# Patient Record
Sex: Male | Born: 1980 | Race: White | Hispanic: No | Marital: Married | Smoking: Current every day smoker
Health system: Southern US, Community
[De-identification: ages and names within clinical notes are randomized; demographics above are authoritative.]

## PROBLEM LIST (undated history)

## (undated) DIAGNOSIS — M1990 Unspecified osteoarthritis, unspecified site: Secondary | ICD-10-CM

## (undated) DIAGNOSIS — I10 Essential (primary) hypertension: Secondary | ICD-10-CM

## (undated) HISTORY — PX: TESTICLE SURGERY: SHX794

---

## 2005-12-03 ENCOUNTER — Emergency Department (HOSPITAL_COMMUNITY): Admission: EM | Admit: 2005-12-03 | Discharge: 2005-12-03 | Payer: Self-pay | Admitting: Emergency Medicine

## 2014-07-25 ENCOUNTER — Emergency Department (HOSPITAL_COMMUNITY)
Admission: EM | Admit: 2014-07-25 | Discharge: 2014-07-25 | Disposition: A | Discharge status: Home / Self Care | Payer: Medicaid Other | Attending: Emergency Medicine | Admitting: Emergency Medicine

## 2014-07-25 ENCOUNTER — Encounter (HOSPITAL_COMMUNITY): Payer: Self-pay | Admitting: Emergency Medicine

## 2014-07-25 ENCOUNTER — Emergency Department (HOSPITAL_COMMUNITY): Payer: Medicaid Other

## 2014-07-25 DIAGNOSIS — S6991XA Unspecified injury of right wrist, hand and finger(s), initial encounter: Secondary | ICD-10-CM

## 2014-07-25 DIAGNOSIS — Y929 Unspecified place or not applicable: Secondary | ICD-10-CM | POA: Diagnosis not present

## 2014-07-25 DIAGNOSIS — S6990XA Unspecified injury of unspecified wrist, hand and finger(s), initial encounter: Secondary | ICD-10-CM | POA: Insufficient documentation

## 2014-07-25 DIAGNOSIS — I10 Essential (primary) hypertension: Secondary | ICD-10-CM | POA: Insufficient documentation

## 2014-07-25 DIAGNOSIS — Y939 Activity, unspecified: Secondary | ICD-10-CM | POA: Insufficient documentation

## 2014-07-25 DIAGNOSIS — F17200 Nicotine dependence, unspecified, uncomplicated: Secondary | ICD-10-CM | POA: Diagnosis not present

## 2014-07-25 DIAGNOSIS — W1809XA Striking against other object with subsequent fall, initial encounter: Secondary | ICD-10-CM | POA: Diagnosis not present

## 2014-07-25 HISTORY — DX: Essential (primary) hypertension: I10

## 2014-07-25 MED ORDER — TRAMADOL HCL 50 MG PO TABS: 50.0000 mg | ORAL_TABLET | Freq: Four times a day (QID) | ORAL | Status: AC | PRN

## 2014-07-25 MED ORDER — OXYCODONE-ACETAMINOPHEN 5-325 MG PO TABS
1.0000 | ORAL_TABLET | Freq: Once | ORAL | Status: AC
  Administered 2014-07-25: 1 via ORAL
  Filled 2014-07-25: qty 1
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
## 2014-07-25 NOTE — Discharge Instructions (Signed)
°Emergency Department Resource Guide °1) Find a Doctor and Pay Out of Pocket °Although you won't have to find out who is covered by your insurance plan, it is a good idea to ask around and get recommendations. You will then need to call the office and see if the doctor you have chosen will accept you as a new patient and what types of options they offer for patients who are self-pay. Some doctors offer discounts or will set up payment plans for their patients who do not have insurance, but you will need to ask so you aren't surprised when you get to your appointment. ° °2) Contact Your Local Health Department °Not all health departments have doctors that can see patients for sick visits, but many do, so it is worth a call to see if yours does. If you don't know where your local health department is, you can check in your phone book. The CDC also has a tool to help you locate your state's health department, and many state websites also have listings of all of their local health departments. ° °3) Find a Walk-in Clinic °If your illness is not likely to be very severe or complicated, you may want to try a walk in clinic. These are popping up all over the country in pharmacies, drugstores, and shopping centers. They're usually staffed by nurse practitioners or physician assistants that have been trained to treat common illnesses and complaints. They're usually fairly quick and inexpensive. However, if you have serious medical issues or chronic medical problems, these are probably not your best option. ° °No Primary Care Doctor: °- Call [facility] at  832-8000 - they can help you locate a primary care doctor that  accepts your insurance, provides certain services, etc. °- Physician Referral Service- 1-800-533-3463 ° °Chronic Pain Problems: °Organization         Address  Phone   Notes  °[facility]  (336) 297-2271 Patients need to be referred by their primary care doctor.  ° °Medication  Assistance: °Organization         Address  Phone   Notes  °[place] [facility] [address] °[place], [place] [address] (336) 641-8030 --Must be a resident of [place] °-- Must have NO insurance coverage whatsoever (no Medicaid/ Medicare, etc.) °-- The pt. MUST have a primary care doctor that directs their care regularly and follows them in the community °  °[facility]  (866) 331-1348   °[employer]  (888) 892-1162   ° °Agencies that provide inexpensive medical care: °Organization         Address  Phone   Notes  °[facility]  (336) 832-8035   °[facility]    (336) 832-7272   °[facility] [address] °[place], [place] [address] (336) 832-4777   °[facility] [address], °[place] (336) 271-4999   °[facility]    (336) 373-0678   °[facility]    (336) 272-1050   °[facility] ° [address], [place] Phone:  (336) 832-4444, Fax:  (336) 832-4440 Hours of Operation:  9 am - 6 pm, M-F.  Also accepts Medicaid/Medicare and self-pay.  °[facility] ° [address],  Phone: (336) 832-3150, Fax: (336) 832-3151. Hours of Operation:  8:30 am - 5:30 pm, M-F.  Also accepts Medicaid and self-pay.  °[facility] [place] [address], [place] Phone: (336) 878-6027   °[facility] [address], [place], [place] (336)723-1848, Ext. 123 Mondays & Thursdays: 7-9 AM.  First 15 patients are seen on a first come, first serve basis. °  ° °Medicaid-accepting [place] Providers: ° °Organization         Address  Phone   Notes  °[facility] [address], [place] (336) 641-2100 Also accepts self-pay patients.  °[facility] [address], [place] ° (336) 856-9996   °[facility] [address], [place]  (336) 288-8857   °[facility] [address] [place] [address], [place] (336) 299-7000   °Veita Bland [address], [place]  ° (336) 373-1557 Only accepts [place] Access Medicaid patients after they have their name applied to their card.  ° °Self-Pay (no insurance) in [place]: ° °Organization         Address  Phone   Notes  °Sickle Cell Patients, [facility] [address], [place] (336) 832-1970   °[facility] [address], [place] (336) 832-4400   °[facility] ° [address], [place] (336) 992-4800   °[facility]/Dr. Osei-Bonsu ° [address] [place] [address], [place] or [address], [place] (336) 841-8500 Phone number for both [place] and [place] locations is the same.  °[facility] [address], [place] (336) 299-0000   °[facility] [address] [place] [address], [place] or [address] (336) 852-7530 °(336) 878-2260   °[facility] [address] [place] [address], [place] (336) 350-1642, phone; (336) 294-5005, fax Sees patients 1st and 3rd Saturday of every month.  Must not qualify for public or private insurance (i.e. Medicaid, Medicare, [place] Health Choice, Veterans' Benefits) • Household income should be no more than 200% of the poverty level •The clinic cannot treat you if you are pregnant or think you are pregnant • Sexually transmitted diseases are not treated at the clinic.  ° ° °Dental Care: °Organization         Address  Phone  Notes  °[place] [facility] [address], [place] (336) 641-6152 Accepts children up to age 21 who are enrolled in Medicaid or [place] Health Choice; pregnant women with a Medicaid card; and children who have applied for Medicaid or [place] Health Choice, but were declined, whose parents can pay a reduced fee at time of service.  °[place]  [facility] [place]  [address], [place] (336) 641-7733 Accepts children up to age 21 who are enrolled in Medicaid or [place] Health Choice; pregnant women with a Medicaid card; and children who have applied for Medicaid or [place] Health Choice, but were declined, whose parents can pay a reduced fee at time of service.  °[facility] ° [address],  (336) 641-4533 Patients are seen by appointment only. Walk-ins are not accepted. [facility] will see patients 18 years of age and older. °Monday - Tuesday (8am-5pm) °Most Wednesdays (8:30-5pm) °$30 per visit, cash only  °[facility] ° [address], [place] (336) 641-4533 Patients are seen by appointment only. Walk-ins are not accepted. [facility] will see patients 18 years of age and older. °One   Wednesday Evening (Monthly: Volunteer Based).  $30 per visit, cash only  °[facility]  (919) 537-3737 for adults; Children under age 4, call Graduate Pediatric Dentistry at (919) [phone]. Children aged 4-14, please call (919) [phone] to request a pediatric application. ° Dental services are provided in all areas of dental care including fillings, crowns and bridges, complete and partial dentures, implants, gum treatment, root canals, and extractions. Preventive care is also provided. Treatment is provided to both adults and children. °Patients are selected via a lottery and there is often a waiting list. °  °[facility] [address], °[place] ° (336) 763-8833 [email or web address] °  °[facility] [address], [place], [place] (336)723-1848, Ext. 123 Second and Fourth Thursday of each month, opens at 6:30 AM; Clinic ends at 9 AM.  Patients are seen on a first-come first-served basis, and a limited number are seen during each clinic.  ° °[facility] ° [address], [place], [place] (336) 723-7904    Eligibility Requirements °You must have lived in [place], [place], or [place] counties for at least the last three months. °  You cannot be eligible for state or federal sponsored healthcare insurance, including Veterans Administration, Medicaid, or Medicare. °  You generally cannot be eligible for healthcare insurance through your employer.  °  How to apply: °Eligibility screenings are held every Tuesday and Wednesday afternoon from 1:00 pm until 4:00 pm. You do not need an appointment for the interview!  °[facility] [address], [place], [place] 336-631-2330   °[facility]  336-342-8273   °[facility]  336-703-3100   °[facility]  336-570-6415   ° °Behavioral Health Resources in the Community: °Intensive Outpatient Programs °Organization         Address  Phone  Notes  °[facility] [address], [place], [place] 336-878-6098   °[facility] [address], [place], [place] 336-832-9800   °[facility] [address], [place], [place] ° 336-882-2125   °[facility] [address],  °[place], [place] 1-800-853-5163 or 336-641-4981   °Substance Abuse Resources °Organization         Address  Phone  Notes  °[facility]  336-882-2125   °[facility]  336-784-9470   °[facility]  336-285-9073   °[facility]  336-845-3988   °Residential & Outpatient Substance Abuse Program  1-800-659-3381   °Psychological Services °Organization         Address  Phone  Notes  °[facility]  336- 832-9600   °[facility]  336- 378-7881   °[facility] [address], [place] 1-800-853-5163 or 336-641-4981   ° °Mobile Crisis Teams °Organization         Address  Phone  Notes  °[facility], Mobile Crisis Care Unit  1-877-626-1772   °Assertive °[facility] ° [address]  [place], [place] 336-834-9664   °Sharon DeEsch [address] °[place] 336-554-5454   ° °Self-Help/Support Groups °Organization         Address  Phone             Notes  °[facility] - variety of support groups  336- 373-1402 Call for more information  °Narcotics Anonymous (NA), [facility] [address], °[place]  2 meetings at this location  ° °  Residential Treatment Programs °Organization         Address  Phone  Notes  °[facility] [address],    °[place]  1-866-801-8205   °[facility] ° [address], [place], [place] 704-293-8524   °[facility] [address], [place] 336-845-3988 Admissions: 8am-3pm M-F  °[facility] [address]    °[place], [place] 336-841-1104   °[facility] [address], [place], [place] 336-379-7146   °[facility] [address]  °[place], [place] 336-285-9073   °[facility] - Intensive Outpatient [address], [place], [place] 336-852-3033   °[facility] [address]  °[place], [place] 1-877-615-2722 or 336-784-9470   °[facility] [address] [place], [place] 336-227-7417 Accepts Medicaid  °[facility] [address]  ° [place] 1-800-659-3381 Substance Abuse/Addiction Treatment  ° °[place] Behavioral Health Resources °Organization         Address  Phone  Notes  °[facility]  (888) 581-9988   °Julie Brannon, PhD [address] [place], [place]   (336) 349-5553 or (336) 951-0000   °[facility]   [address] °[place], [place] (336) 349-4454   °[facility] [address], [place], [place] (336) 342-8316 Insurance/Medicaid/sponsorship through [facility]  °[facility] [address]                                    [place], [place] (336) 342-8316 Therapy/tele-psych/case    °[facility] [address]  ° [place], [place] (336) 349-2233    °Dr. Arfeen  (336) 349-4544   °[facility] [place]  [employer] [place] [facility] 1) [address], [place] °2) [address], [place] °3)  [address], [place] (336) 349-3220 °(336) 342-7768 ° °(336) 342-8140   °[place] [facility] (336) 342-1394 or (336) 342-3537 (After Hours)    ° ° °
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
## 2014-07-28 NOTE — ED Provider Notes (Signed)
Medical screening examination/treatment/procedure(s) were performed by non-physician practitioner and as supervising physician I was immediately available for consultation/collaboration.   EKG Interpretation None        Dawnmarie Breon, MD 07/28/14 1034 

## 2014-10-19 ENCOUNTER — Emergency Department (HOSPITAL_BASED_OUTPATIENT_CLINIC_OR_DEPARTMENT_OTHER)
Admission: EM | Admit: 2014-10-19 | Discharge: 2014-10-19 | Disposition: A | Discharge status: Home / Self Care | Payer: Medicaid Other | Attending: Emergency Medicine | Admitting: Emergency Medicine

## 2014-10-19 ENCOUNTER — Encounter (HOSPITAL_BASED_OUTPATIENT_CLINIC_OR_DEPARTMENT_OTHER): Payer: Self-pay | Admitting: *Deleted

## 2014-10-19 DIAGNOSIS — M25571 Pain in right ankle and joints of right foot: Secondary | ICD-10-CM

## 2014-10-19 DIAGNOSIS — Z720 Tobacco use: Secondary | ICD-10-CM | POA: Insufficient documentation

## 2014-10-19 DIAGNOSIS — I10 Essential (primary) hypertension: Secondary | ICD-10-CM | POA: Diagnosis not present

## 2014-10-19 HISTORY — DX: Unspecified osteoarthritis, unspecified site: M19.90

## 2014-10-19 MED ORDER — IBUPROFEN 400 MG PO TABS
600.0000 mg | ORAL_TABLET | Freq: Once | ORAL | Status: DC
  Filled 2014-10-19 (×2): qty 1

## 2014-10-19 NOTE — ED Notes (Signed)
In to give Pt. His Motrin and he and his girlfriend left the room and out of the hospital.

## 2014-10-19 NOTE — ED Provider Notes (Signed)
CSN: [id number]     Arrival date & time 10/19/14  [id number] History   This chart was scribed for Hector SkeensJoshua M Malekai Markwood, MD by Evon Slackerrance Branch, ED Scribe. This patient was seen in room MH10/MH10 and the patient's care was started at 7:33 PM.      Chief Complaint  Patient presents with  . Ankle Pain   Patient is a 33 y.o. male presenting with ankle pain. The history is provided by the patient. No language interpreter was used.  Ankle Pain Location:  Ankle Injury: no   Ankle location:  R ankle Pain details:    Radiates to:  Does not radiate   Severity:  Mild Associated symptoms: no fever    HPI Comments: Hector ReichmannJeremy Ayers is a 33 y.o. male who presents to the Emergency Department complaining of right ankle pain onset tonight. He states he has a Hx of arthritis. He states that he has tried ibuprofen with no relief.  He denies any recent injuries to the ankle. Denies joint swelling or fevers.       Past Medical History  Diagnosis Date  . Hypertension   . Arthritis    Past Surgical History  Procedure Laterality Date  . Testicle surgery     No family history on file. History  Substance Use Topics  . Smoking status: Current Every Day Smoker -- 0.50 packs/day    Types: Cigarettes  . Smokeless tobacco: Not on file  . Alcohol Use: No    Review of Systems  Constitutional: Negative for fever.  Musculoskeletal: Positive for arthralgias. Negative for joint swelling.      Allergies  Toradol and Tramadol  Home Medications   Prior to Admission medications   Medication Sig Start Date End Date Taking? Authorizing Provider  traMADol (ULTRAM) 50 MG tablet Take 1 tablet (50 mg total) by mouth every 6 (six) hours as needed. 07/25/14   Hector GladHeather Laisure, PA-C   Triage Vitals: BP 139/85 mmHg  Pulse 96  Temp(Src) 98.4 F (36.9 C) (Oral)  Resp 18  Ht 5\' 6"  (1.676 m)  Wt 200 lb (90.719 kg)  BMI 32.30 kg/m2  SpO2 97%  Physical Exam  Constitutional: He is oriented to person, place, and time. He  appears well-developed and well-nourished. No distress.  HENT:  Head: Normocephalic and atraumatic.  Eyes: Conjunctivae and EOM are normal.  Neck: Neck supple.  Cardiovascular: Normal rate.   Pulmonary/Chest: Effort normal. No respiratory distress.  Musculoskeletal: Normal range of motion.  No tenderness to right knee, tenderness to bilateral upper ankle, no significant malleoli tenderness, no warmth to joint, no significant swelling to joint, 2+ DP and PT, right ankle is similar to left ankle, discomfort with all ROM but does have full ROM.  Neurological: He is alert and oriented to person, place, and time.  Skin: Skin is warm and dry.  Psychiatric: He has a normal mood and affect. His behavior is normal.  Nursing note and vitals reviewed.   ED Course  Procedures (including critical care time) DIAGNOSTIC STUDIES: Oxygen Saturation is 97% on RA, normal by my interpretation.    COORDINATION OF CARE: 8:03 PM-Discussed treatment plan which includes ibuprofen with pt at bedside and pt agreed to plan.     Labs Review Labs Reviewed - No data to display  Imaging Review No results found.   EKG Interpretation None      MDM   Final diagnoses:  Right ankle pain   I personally performed the services described in this documentation, which  was scribed in my presence. The recorded information has been reviewed and is accurate.  Patient with recurrent right ankle pain. No suspicion for septic joint at this time, no injury. No indication for acute imaging. Patient initially said he had taken no medications today for the pain and then I discussed NSAIDs and he said that would not help and that he did take ibuprofen today. Patient was not happy that he was not receiving narcotics. Patient left prior to discharge instructions.     Medications  ibuprofen (ADVIL,MOTRIN) tablet 600 mg (600 mg Oral Not Given 10/19/14 2016)    Filed Vitals:   10/19/14 1927  BP: 139/85  Pulse: 96  Temp:  98.4 F (36.9 C)  TempSrc: Oral  Resp: 18  Height: 5\' 6"  (1.676 m)  Weight: 200 lb (90.719 kg)  SpO2: 97%    Final diagnoses:  Right ankle pain         Hector SkeensJoshua M Hector Madera, MD 10/19/14 2018

## 2014-10-19 NOTE — ED Notes (Signed)
Right ankle pain. Hx of arthritis. States he needs pain medication.

## 2014-10-19 NOTE — Discharge Instructions (Signed)
If you were given medicines take as directed.  If you are on coumadin or contraceptives realize their levels and effectiveness is altered by many different medicines.  If you have any reaction (rash, tongues swelling, other) to the medicines stop taking and see a physician.   Ibuprofen and tylenol for pain Please follow up as directed and return to the ER or see a physician for new or worsening symptoms.  Thank you. Filed Vitals:   10/19/14 1927  BP: 139/85  Pulse: 96  Temp: 98.4 F (36.9 C)  TempSrc: Oral  Resp: 18  Height: 5\' 6"  (1.676 m)  Weight: 200 lb (90.719 kg)  SpO2: 97%

## 2016-05-18 IMAGING — CR DG WRIST COMPLETE 3+V*R*
4 series · 4 of 4 positions shown · non-contrast
Comparison: None.

CLINICAL DATA: Injured right hand and wrist.

EXAM:
RIGHT WRIST - COMPLETE 3+ VIEW; RIGHT HAND - COMPLETE 3+ VIEW

[x wrist pa right]
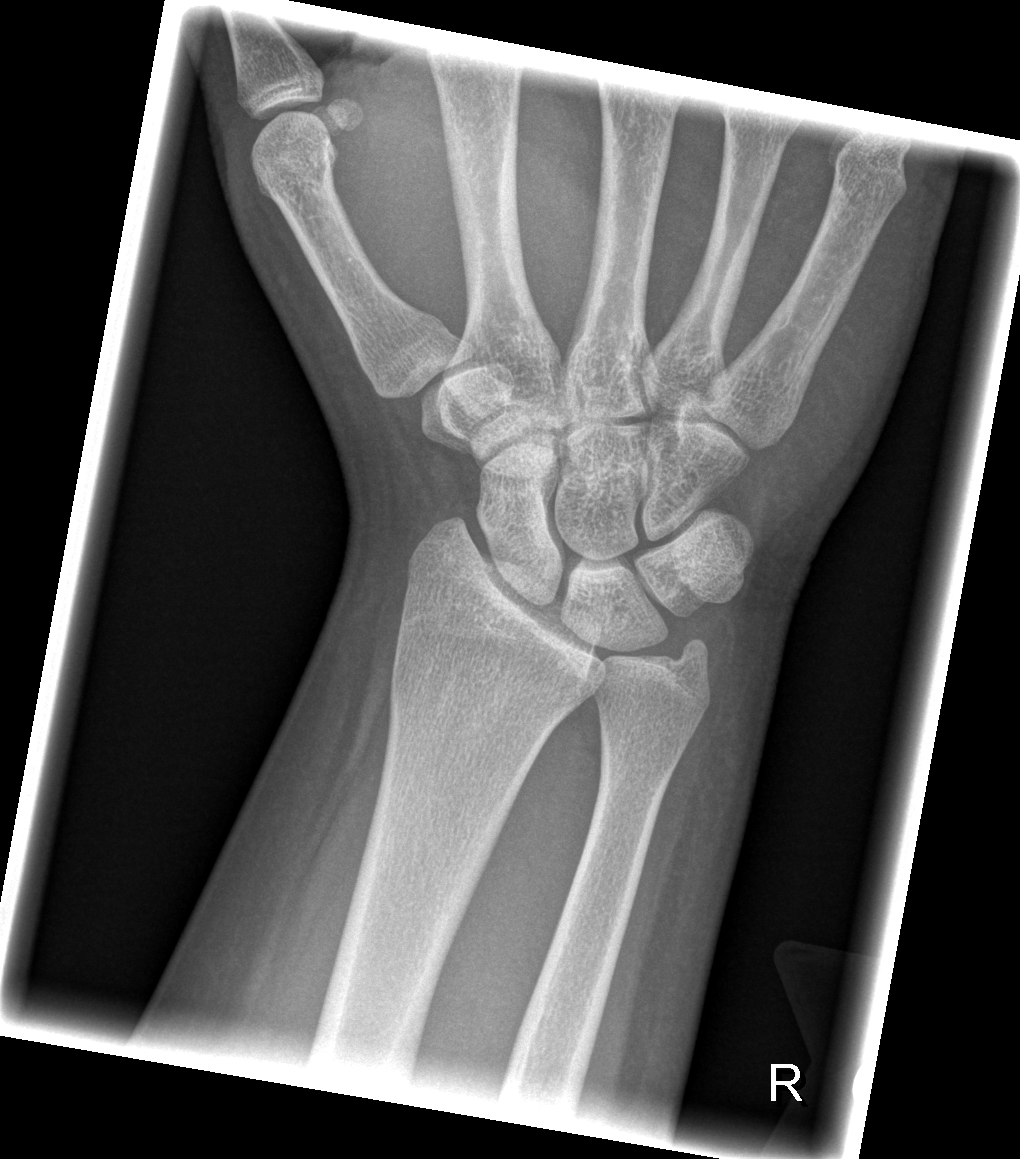

[x wrist obl right]
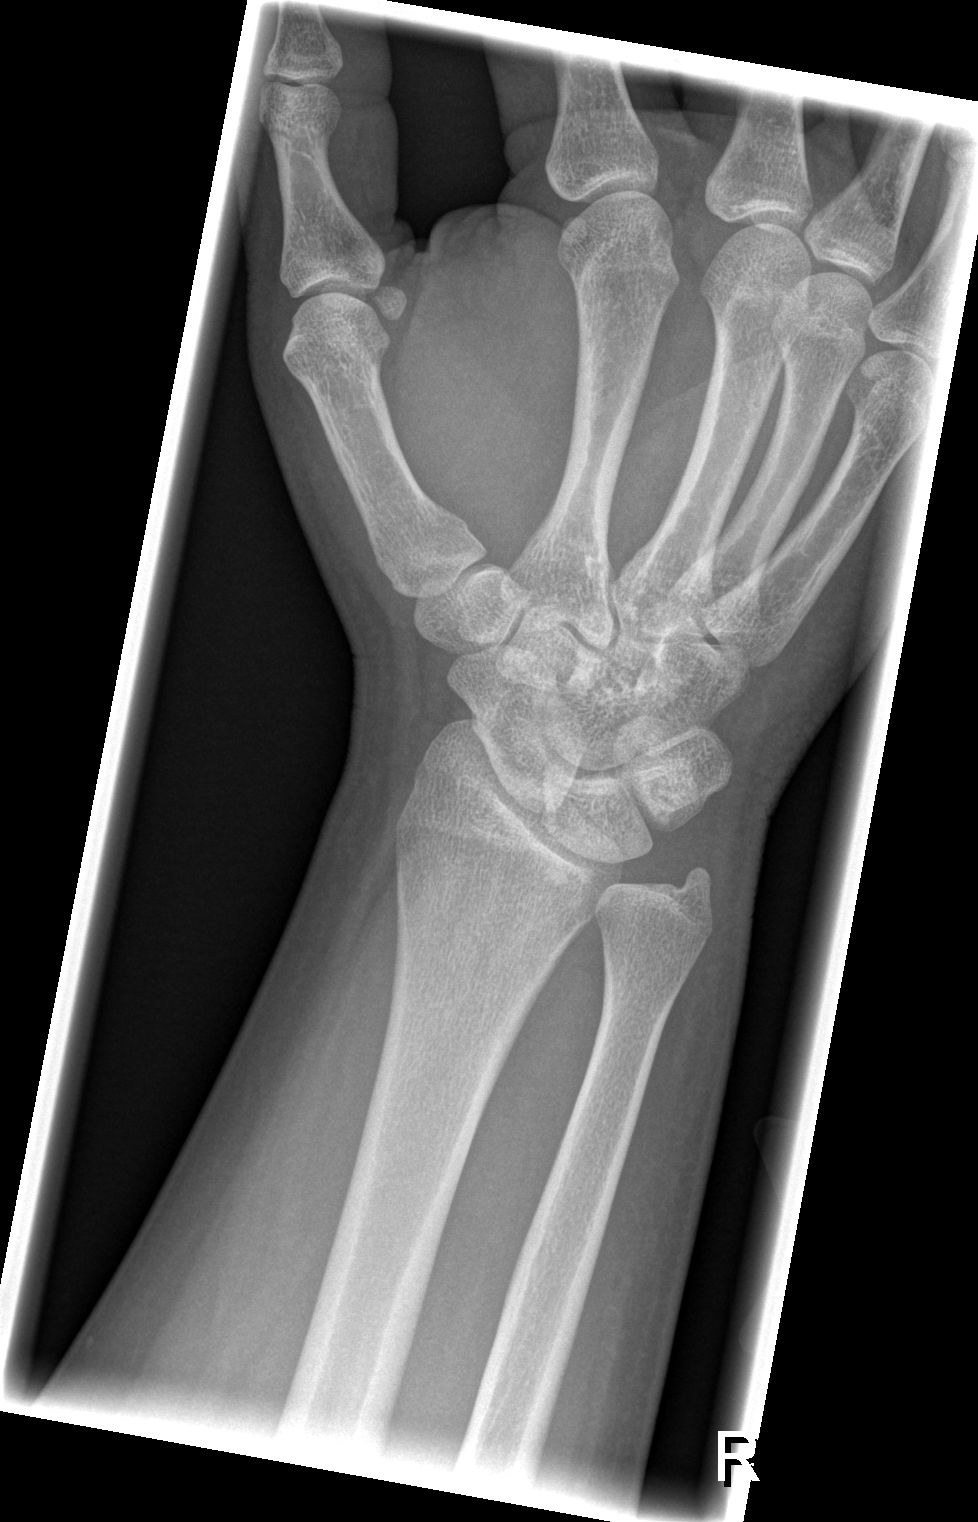

[x wrist lat right]
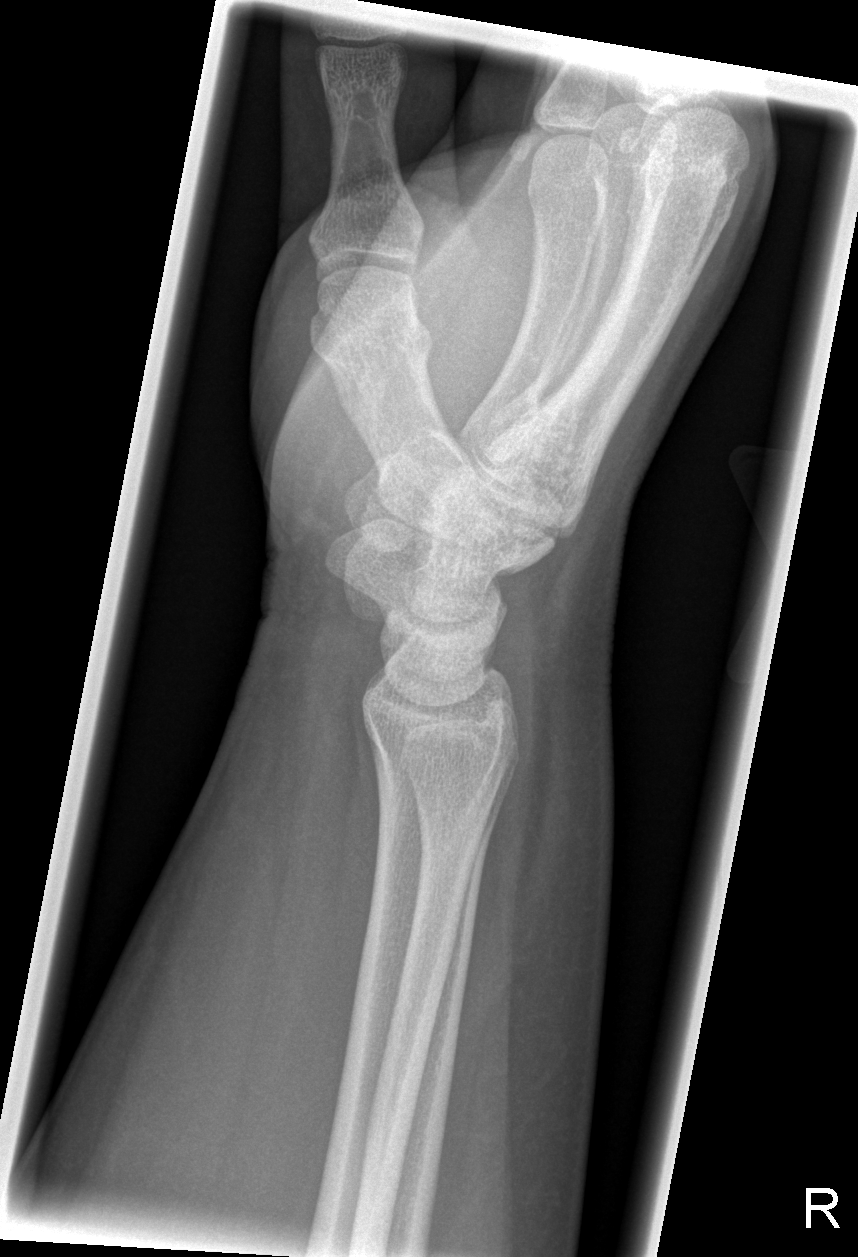

[x navicular]
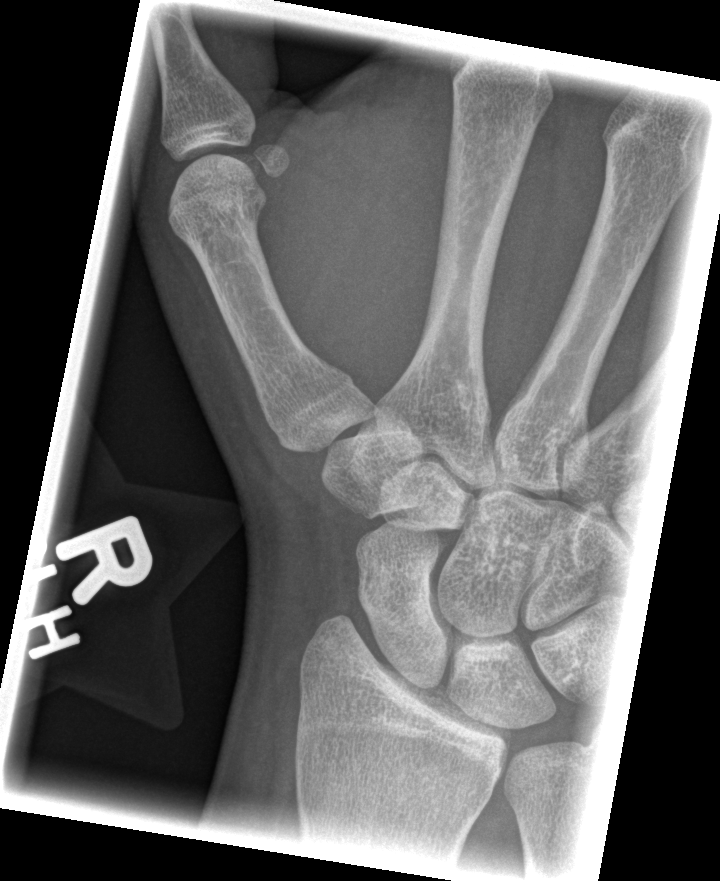

[4 of 4 positions shown; findings below may reference images not displayed]

FINDINGS: Right wrist:

The radiocarpal and intercarpal joint spaces are maintained. No
acute wrist fracture.

Right hand:

The joint spaces are maintained.  No acute fracture is identified.
IMPRESSION: No acute bony findings.

## 2020-06-28 ENCOUNTER — Emergency Department (HOSPITAL_BASED_OUTPATIENT_CLINIC_OR_DEPARTMENT_OTHER)
Admission: EM | Admit: 2020-06-28 | Discharge: 2020-06-28 | Disposition: A | Discharge status: Home / Self Care | Payer: Medicaid Other | Attending: Emergency Medicine | Admitting: Emergency Medicine

## 2020-06-28 ENCOUNTER — Encounter (HOSPITAL_BASED_OUTPATIENT_CLINIC_OR_DEPARTMENT_OTHER): Payer: Self-pay

## 2020-06-28 ENCOUNTER — Other Ambulatory Visit: Payer: Self-pay

## 2020-06-28 DIAGNOSIS — M545 Low back pain, unspecified: Secondary | ICD-10-CM

## 2020-06-28 DIAGNOSIS — F17210 Nicotine dependence, cigarettes, uncomplicated: Secondary | ICD-10-CM | POA: Insufficient documentation

## 2020-06-28 DIAGNOSIS — I10 Essential (primary) hypertension: Secondary | ICD-10-CM | POA: Diagnosis not present

## 2020-06-28 DIAGNOSIS — Z79899 Other long term (current) drug therapy: Secondary | ICD-10-CM | POA: Insufficient documentation

## 2020-06-28 MED ORDER — METHOCARBAMOL 500 MG PO TABS: 500.0000 mg | ORAL_TABLET | Freq: Two times a day (BID) | ORAL | 0 refills | Status: AC
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
## 2024-07-29 ENCOUNTER — Emergency Department (HOSPITAL_BASED_OUTPATIENT_CLINIC_OR_DEPARTMENT_OTHER)
Admission: EM | Admit: 2024-07-29 | Discharge: 2024-07-29 | Disposition: A | Discharge status: Home / Self Care | Attending: Emergency Medicine | Admitting: Emergency Medicine

## 2024-07-29 ENCOUNTER — Encounter (HOSPITAL_BASED_OUTPATIENT_CLINIC_OR_DEPARTMENT_OTHER): Payer: Self-pay | Admitting: Emergency Medicine

## 2024-07-29 ENCOUNTER — Other Ambulatory Visit: Payer: Self-pay

## 2024-07-29 DIAGNOSIS — R040 Epistaxis: Secondary | ICD-10-CM | POA: Diagnosis present

## 2024-07-29 MED ORDER — OXYMETAZOLINE HCL 0.05 % NA SOLN
1.0000 | Freq: Once | NASAL | Status: AC
  Administered 2024-07-29: 1 via NASAL
  Filled 2024-07-29: qty 30

## 2024-07-29 MED ORDER — LISINOPRIL 10 MG PO TABS: 10.0000 mg | ORAL_TABLET | Freq: Every day | ORAL | 0 refills | Status: AC

## 2024-07-29 NOTE — ED Provider Notes (Signed)
 [place] [facility] EMERGENCY DEPARTMENT AT [facility] Provider Note   CSN: [id number] Arrival date & time: 07/29/24  8360     Patient presents with: Epistaxis  HPI Hector Ayers is a 43 y.o. male presenting for epistaxis.  He states he noticed bleeding from his nose twice today.  Control both times with direct pressure.  He states this is unusual for him which is prompting him to be evaluated here.  Also reports a history of hypertension but has not been taking his blood pressure medications for several months.  He denies shortness of breath, fatigue lightheadedness or chest pain.    Epistaxis      Prior to Admission medications   Medication Sig Start Date End Date Taking? Authorizing Provider  atorvastatin (LIPITOR) 10 MG tablet Take by mouth.    [provider]  diclofenac (VOLTAREN) 75 MG EC tablet Take by mouth. 08/22/17   [provider]  lisinopril  (ZESTRIL ) 10 MG tablet Take 1 tablet (10 mg total) by mouth daily. 07/29/24   Sagan Wurzel K, PA-C  methocarbamol  (ROBAXIN ) 500 MG tablet Take 1 tablet (500 mg total) by mouth 2 (two) times daily. 06/28/20   Layden, Lindsey A, PA-C  traMADol  (ULTRAM ) 50 MG tablet Take 1 tablet (50 mg total) by mouth every 6 (six) hours as needed. 07/25/14   Nasario Moats, PA-C    Allergies: Toradol [ketorolac tromethamine] and Tramadol     Review of Systems  HENT:  Positive for nosebleeds.     Updated Vital Signs BP (!) 156/98 (BP Location: Right Arm)   Pulse 84   Temp 98.1 F (36.7 C) (Oral)   Resp 16   Ht 5' 6 (1.676 m)   Wt 103 kg   SpO2 97%   BMI 36.64 kg/m   Physical Exam Constitutional:      Appearance: Normal appearance.  HENT:     Head: Normocephalic.     Nose: Nose normal.     Comments: Dried blood noted in both nares right greater than left.  No active bleeding noted.  Posterior oropharynx appears grossly normal.  Both nares are patent. Eyes:     Conjunctiva/sclera: Conjunctivae normal.   Pulmonary:     Effort: Pulmonary effort is normal.  Neurological:     Mental Status: He is alert.  Psychiatric:        Mood and Affect: Mood normal.     (all labs ordered are listed, but only abnormal results are displayed) Labs Reviewed - No data to display  EKG: None  Radiology: No results found.   Procedures   Medications Ordered in the ED  oxymetazoline  (AFRIN) 0.05 % nasal spray 1 spray (has no administration in time range)                                    Medical Decision Making  43 year old well-appearing male presenting for epistaxis.  Exam notable for dried blood in both naris but no active bleeding.  Blood pressure is slightly elevated.  Treated with Afrin.  Suspect mild anterior bleed that is likely resolved.  Sent a month supply of his lisinopril  to his pharmacy.  Advised him to follow-up with his PCP.  Discussed return precautions.  Discharged in good condition.     Final diagnoses:  Epistaxis    ED Discharge Orders          Ordered    lisinopril  (ZESTRIL ) 10 MG  tablet  Daily        07/29/24 1655               Lang Norleen MARLA DEVONNA 07/29/24 1659    Lenor Hollering, MD 07/29/24 [phone]
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
## 2024-07-29 NOTE — Discharge Instructions (Addendum)
 Evaluation today was overall reassuring.  As we discussed your blood pressure is elevated.  I did send a month supply of your blood pressure medication to your pharmacy.  You can also use Afrin for your intermittent nosebleed.  Please return if the bleeding worsens, you become short of breath or lightheaded.  Please follow-up with your PCP.
